# Patient Record
Sex: Female | Born: 1975 | Hispanic: No | Marital: Single
Health system: Southern US, Community
[De-identification: ages and names within clinical notes are randomized; demographics above are authoritative.]

---

## 2001-04-18 ENCOUNTER — Ambulatory Visit (HOSPITAL_COMMUNITY): Admission: RE | Admit: 2001-04-18 | Discharge: 2001-04-18 | Payer: Self-pay | Admitting: *Deleted

## 2001-07-12 ENCOUNTER — Inpatient Hospital Stay (HOSPITAL_COMMUNITY): Admission: AD | Admit: 2001-07-12 | Discharge: 2001-07-12 | Payer: Self-pay | Admitting: *Deleted

## 2001-07-12 ENCOUNTER — Inpatient Hospital Stay (HOSPITAL_COMMUNITY): Admission: AD | Admit: 2001-07-12 | Discharge: 2001-07-14 | Payer: Self-pay | Admitting: Gynecology

## 2005-02-08 ENCOUNTER — Ambulatory Visit: Payer: Self-pay | Admitting: Certified Nurse Midwife

## 2005-02-08 ENCOUNTER — Inpatient Hospital Stay (HOSPITAL_COMMUNITY): Admission: AD | Admit: 2005-02-08 | Discharge: 2005-02-10 | Payer: Self-pay | Admitting: Family Medicine

## 2019-08-09 ENCOUNTER — Ambulatory Visit
Admission: RE | Admit: 2019-08-09 | Discharge: 2019-08-09 | Disposition: A | Discharge status: Home / Self Care | Payer: Self-pay | Source: Ambulatory Visit | Attending: Chiropractic Medicine | Admitting: Chiropractic Medicine

## 2019-08-09 ENCOUNTER — Other Ambulatory Visit: Payer: Self-pay

## 2019-08-09 ENCOUNTER — Other Ambulatory Visit: Payer: Self-pay | Admitting: Chiropractic Medicine

## 2019-08-09 DIAGNOSIS — R52 Pain, unspecified: Secondary | ICD-10-CM

## 2020-01-03 ENCOUNTER — Ambulatory Visit: Payer: Self-pay | Attending: Internal Medicine

## 2020-01-03 DIAGNOSIS — Z23 Encounter for immunization: Secondary | ICD-10-CM

## 2020-01-03 NOTE — Progress Notes (Signed)
   Covid-19 Vaccination Clinic  Name:  Gloria Sanford    MRN: [id number] DOB: [birth year]  01/03/2020  Gloria Sanford was observed post Covid-19 immunization for 15 minutes without incident. She was provided with Vaccine Information Sheet and instruction to access the V-Safe system.   Gloria Sanford was instructed to call 911 with any severe reactions post vaccine: Marland Kitchen Difficulty breathing  . Swelling of face and throat  . A fast heartbeat  . A bad rash all over body  . Dizziness and weakness   Immunizations Administered    Name Date Dose VIS Date Route   Pfizer COVID-19 Vaccine 01/03/2020  3:45 PM 0.3 mL 09/20/2019 Intramuscular   Manufacturer: [employer], [employer]   Lot: [id number]   NDC: [address]-2

## 2020-01-24 ENCOUNTER — Ambulatory Visit: Payer: Self-pay | Attending: Internal Medicine

## 2020-01-24 DIAGNOSIS — Z23 Encounter for immunization: Secondary | ICD-10-CM

## 2020-01-24 NOTE — Progress Notes (Signed)
   Covid-19 Vaccination Clinic  Name:  Peytin Dechert    MRN: [id number] DOB: [birth year]  01/24/2020  Ms. Henriquez Barillas was observed post Covid-19 immunization for 15 minutes without incident. She was provided with Vaccine Information Sheet and instruction to access the V-Safe system.   Ms. Maybelle Depaoli was instructed to call 911 with any severe reactions post vaccine: Marland Kitchen Difficulty breathing  . Swelling of face and throat  . A fast heartbeat  . A bad rash all over body  . Dizziness and weakness   Immunizations Administered    Name Date Dose VIS Date Route   Pfizer COVID-19 Vaccine 01/24/2020  3:52 PM 0.3 mL 09/20/2019 Intramuscular   Manufacturer: [employer], [employer]   Lot: [id number]   NDC: [address]-1

## 2020-06-10 ENCOUNTER — Ambulatory Visit: Payer: Self-pay

## 2020-06-30 ENCOUNTER — Ambulatory Visit: Payer: Self-pay | Attending: Oncology

## 2020-06-30 ENCOUNTER — Encounter (INDEPENDENT_AMBULATORY_CARE_PROVIDER_SITE_OTHER): Payer: Self-pay

## 2020-06-30 ENCOUNTER — Ambulatory Visit
Admission: RE | Admit: 2020-06-30 | Discharge: 2020-06-30 | Disposition: A | Discharge status: Home / Self Care | Payer: Self-pay | Source: Ambulatory Visit | Attending: Oncology | Admitting: Oncology

## 2020-06-30 ENCOUNTER — Other Ambulatory Visit: Payer: Self-pay

## 2020-06-30 VITALS — BP 125/64 | HR 68 | Temp 97.0°F | Resp 20 | Wt 180.0 lb

## 2020-06-30 DIAGNOSIS — Z0000 Encounter for general adult medical examination without abnormal findings: Secondary | ICD-10-CM

## 2020-06-30 NOTE — Progress Notes (Signed)
  Subjective:     Patient ID: Gloria Sanford, female   DOB: 1976-07-27, 44 y.o.   MRN: [id number]  HPI   Review of Systems     Objective:   Physical Exam Chest:     Breasts:        Right: No swelling, bleeding, inverted nipple, mass, nipple discharge, skin change or tenderness.        Left: No swelling, bleeding, inverted nipple, mass, nipple discharge, skin change or tenderness.        Assessment:     44 year old Hispanic patient presents for [facility] clinic visit.  Gloria Sanford interpreted exam.  Her sister recently diagnosed with breast cancer. Patient screened, and meets [facility] eligibility.  Patient does not have insurance, Medicare or Medicaid. Instructed patient on breast self awareness using teach back method.  Clinical breast exam unremarkable.   Risk Assessment    Risk Scores      06/30/2020   Last edited by: Neita Garnet, CMA   5-year risk: 0.9 %   Lifetime risk: 13 %            Plan:     Sent for bilateral screening mammogram.

## 2020-07-31 NOTE — Progress Notes (Signed)
Letter mailed from [facility] to notify of normal mammogram results.  Patient to return in one year for annual screening.  Copy to [facility]. 

## 2021-05-27 IMAGING — MG DIGITAL SCREENING BILAT W/ TOMO W/ CAD
8 series · 8 of 24 positions shown · non-contrast
Comparison: None.

CLINICAL DATA: Screening.

EXAM:
DIGITAL SCREENING BILATERAL MAMMOGRAM WITH TOMO AND CAD

[L CC synth-2D]
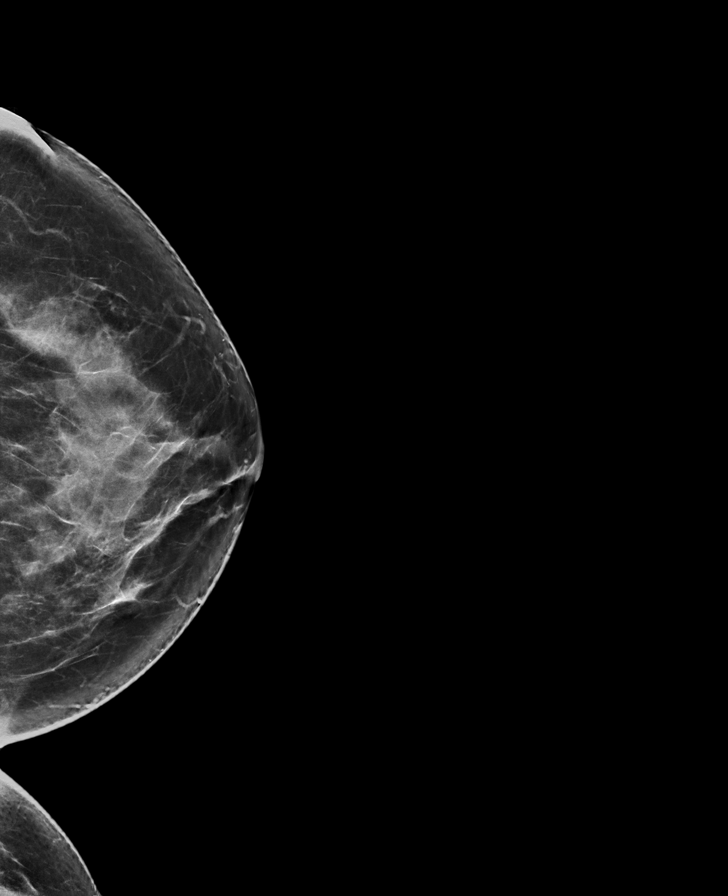

[R MLO synth-2D]
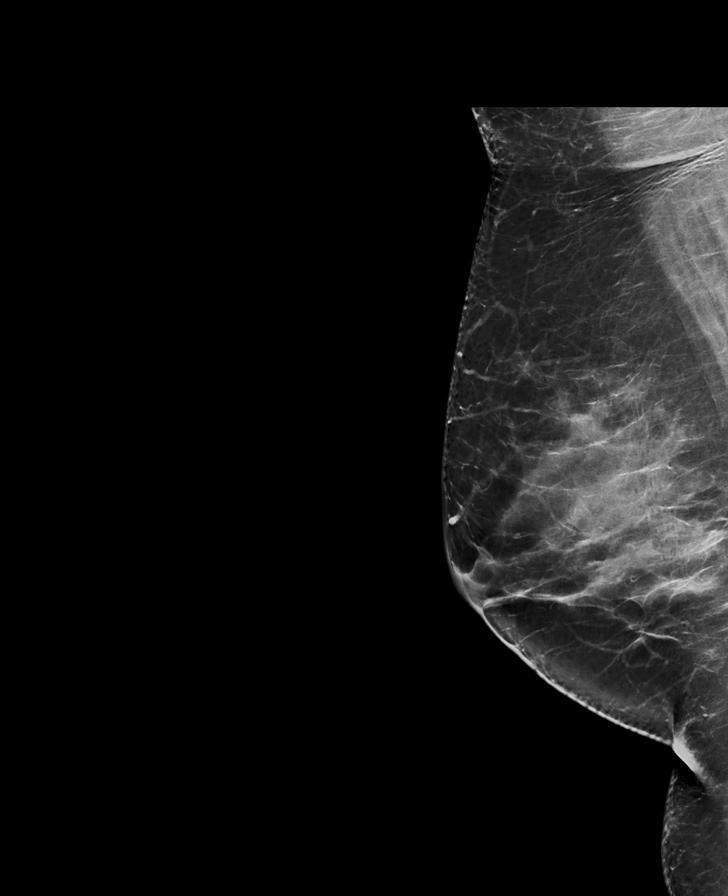

[R CC synth-2D]
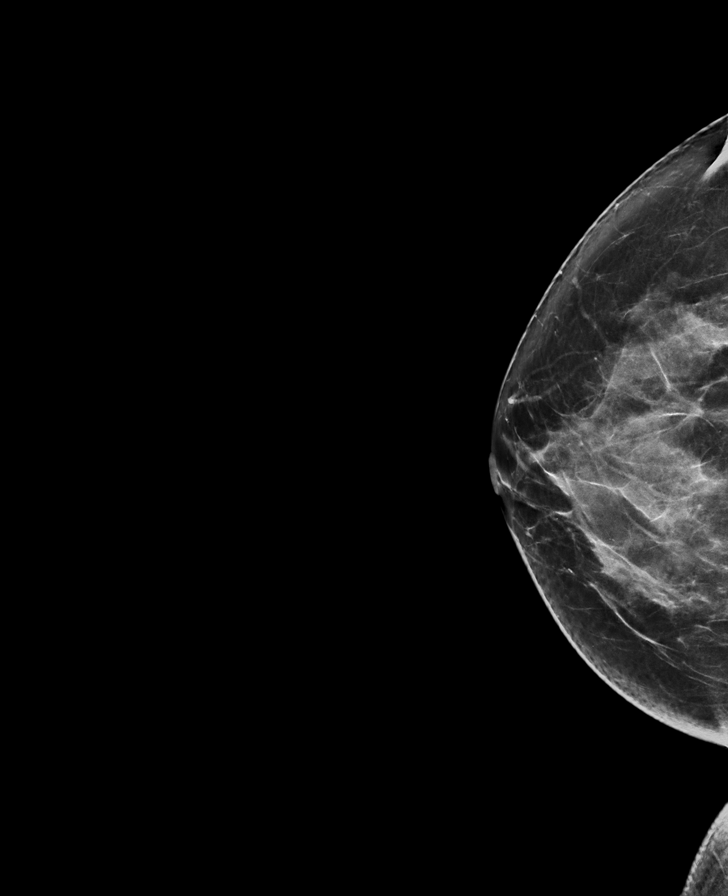

[L MLO synth-2D]
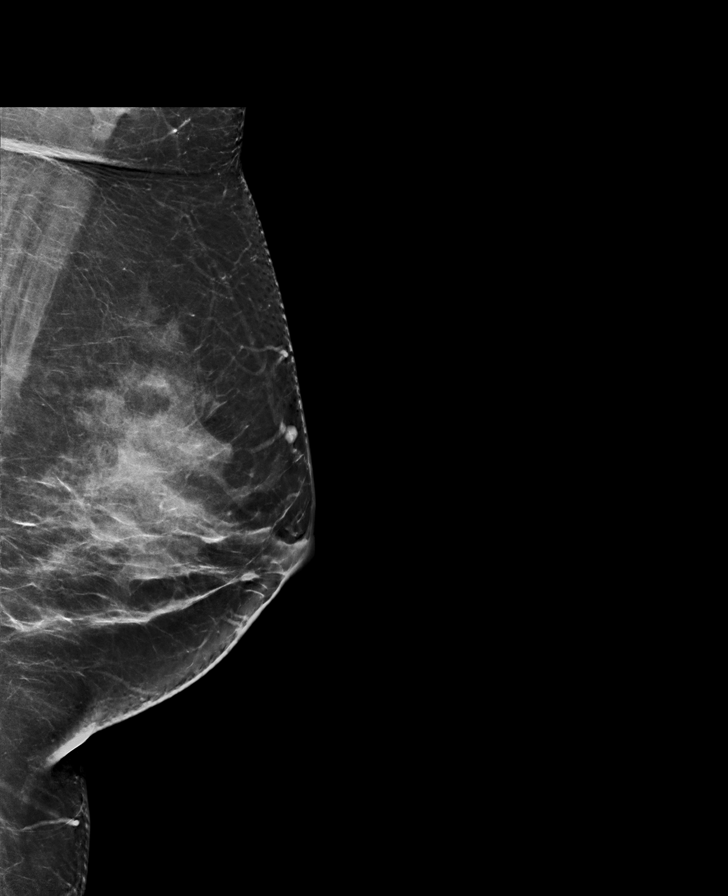

[L MLO tomo · tomo slice 41/82.0]
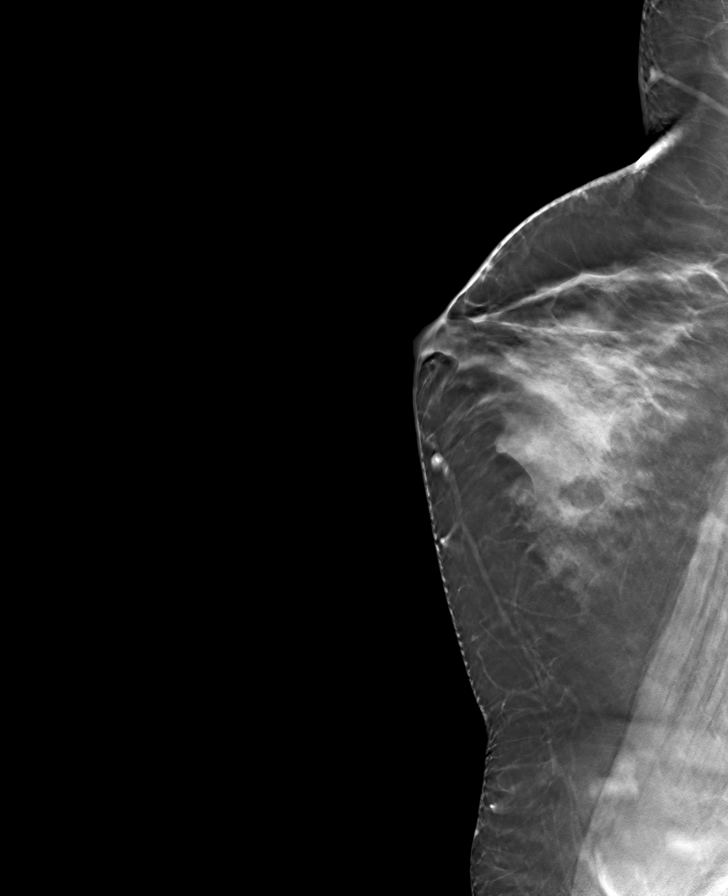

[R MLO tomo · tomo slice 42/83.0]
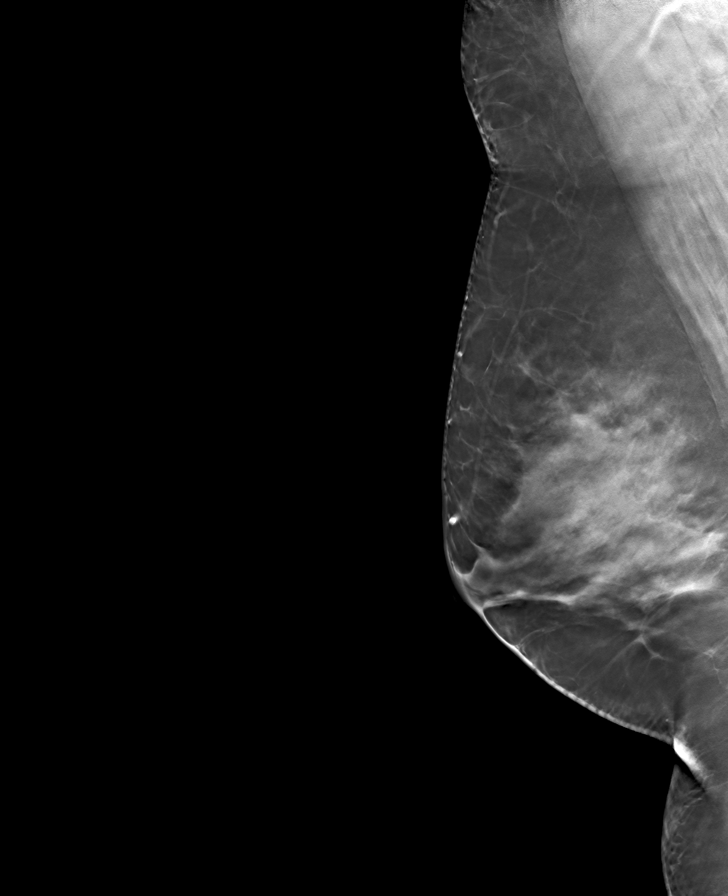

[L CC tomo · tomo slice 39/77.0]
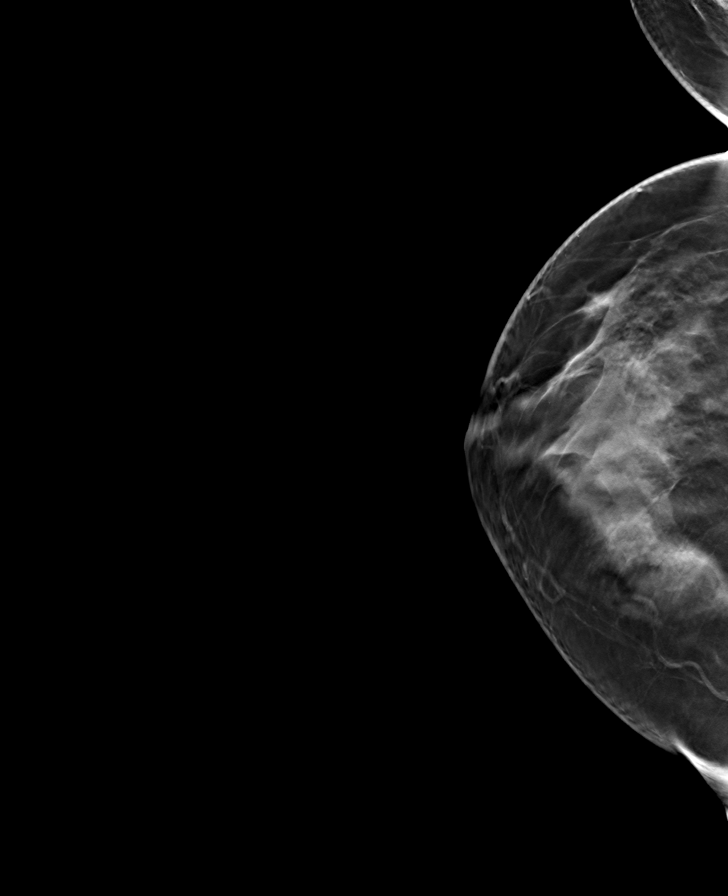

[R CC tomo · tomo slice 37/73.0]
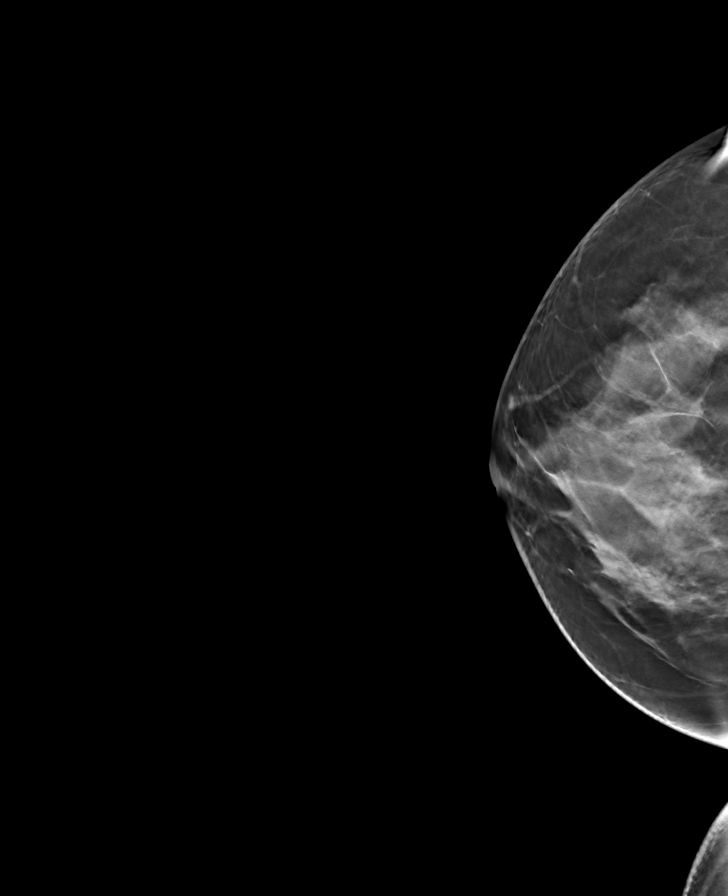

[8 of 24 positions shown; findings below may reference images not displayed]

ACR Breast Density Category c: The breast tissue is heterogeneously
dense, which may obscure small masses
FINDINGS: There are no findings suspicious for malignancy. Images were
processed with CAD.
IMPRESSION: No mammographic evidence of malignancy. A result letter of this
screening mammogram will be mailed directly to the patient.

RECOMMENDATION:
Screening mammogram in one year. (Code:[id number])

BI-RADS CATEGORY  1: Negative.
